# Patient Record
Sex: Female | Born: 1955 | Race: White | Hispanic: No | Marital: Married | Smoking: Never smoker
Health system: Southern US, Community
[De-identification: ages and names within clinical notes are randomized; demographics above are authoritative.]

## PROBLEM LIST (undated history)

## (undated) DIAGNOSIS — N200 Calculus of kidney: Secondary | ICD-10-CM

## (undated) HISTORY — PX: ABDOMINAL HYSTERECTOMY: SHX81

---

## 2009-07-24 ENCOUNTER — Ambulatory Visit: Payer: Self-pay | Admitting: Internal Medicine

## 2013-03-03 ENCOUNTER — Emergency Department: Payer: Self-pay | Admitting: Emergency Medicine

## 2013-03-03 LAB — CBC
MCHC: 34.4 g/dL (ref 32.0–36.0)
MCV: 86 fL (ref 80–100)
Platelet: 277 10*3/uL (ref 150–440)
RBC: 4.88 10*6/uL (ref 3.80–5.20)
RDW: 13.7 % (ref 11.5–14.5)
WBC: 11 10*3/uL (ref 3.6–11.0)

## 2013-03-03 LAB — URINALYSIS, COMPLETE
Blood: NEGATIVE
Nitrite: NEGATIVE
Ph: 8 (ref 4.5–8.0)
Protein: NEGATIVE
RBC,UR: 3 /HPF (ref 0–5)
Squamous Epithelial: NONE SEEN

## 2013-03-03 LAB — BASIC METABOLIC PANEL
Anion Gap: 8 (ref 7–16)
BUN: 19 mg/dL — ABNORMAL HIGH (ref 7–18)
Calcium, Total: 10 mg/dL (ref 8.5–10.1)
Co2: 24 mmol/L (ref 21–32)
Creatinine: 0.97 mg/dL (ref 0.60–1.30)
EGFR (African American): >60
EGFR (Non-African Amer.): >60
Glucose: 133 mg/dL — ABNORMAL HIGH (ref 65–99)
Osmolality: 274 (ref 275–301)

## 2013-03-03 LAB — HEPATIC FUNCTION PANEL A (ARMC)
Alkaline Phosphatase: 114 U/L (ref 50–136)
SGPT (ALT): 31 U/L (ref 12–78)

## 2014-07-18 IMAGING — CT CT ABD-PELV W/ CM
1 of 3 series · 13 of 32 positions shown, 19 images · non-contrast
Comparison: none

REASON FOR EXAM: (1) severe llq pain; (2) severe llq pain
COMMENTS:

PROCEDURE:     CT  - CT ABDOMEN / PELVIS  W  - March 03, 2013  [DATE]
RESULT:     History: Lateral quadrant pain.
Comparison Study: No prior.

[Series 2: 3mm soft tissue · axial · 0.78mm/px · z∈[-90,+342]mm · 13 of 169 slices shown, 19 images]
[im 13/169  soft-tissue]
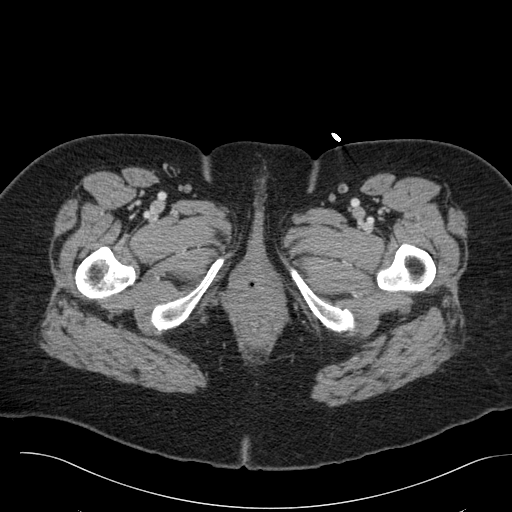
[im 13/169  bone]
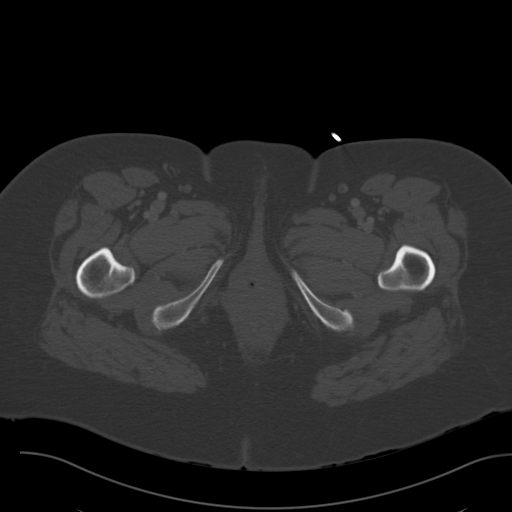
[im 25/169  soft-tissue]
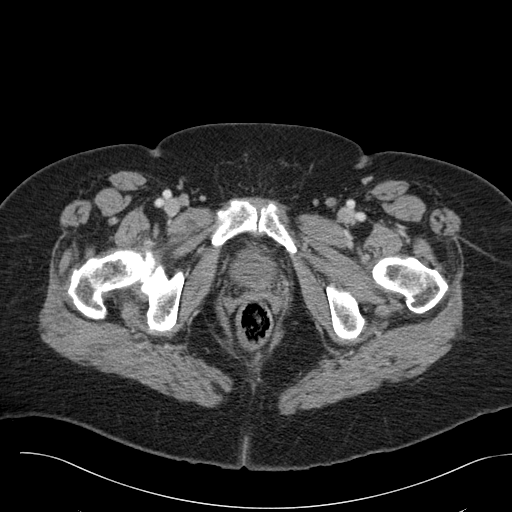
[im 37/169  soft-tissue]
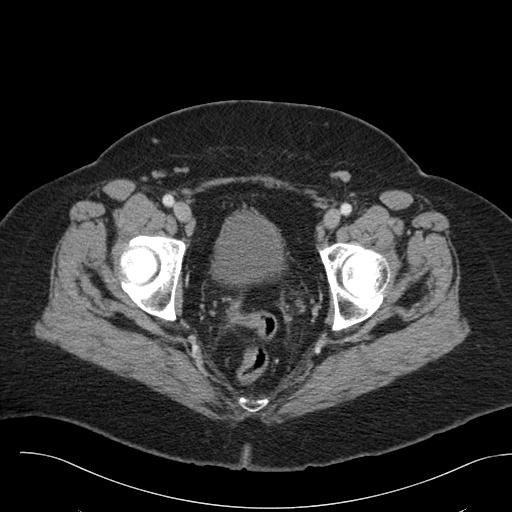
[im 49/169  soft-tissue]
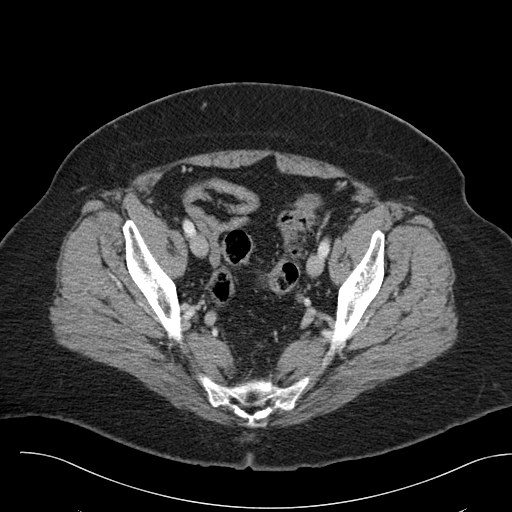
[im 61/169  soft-tissue]
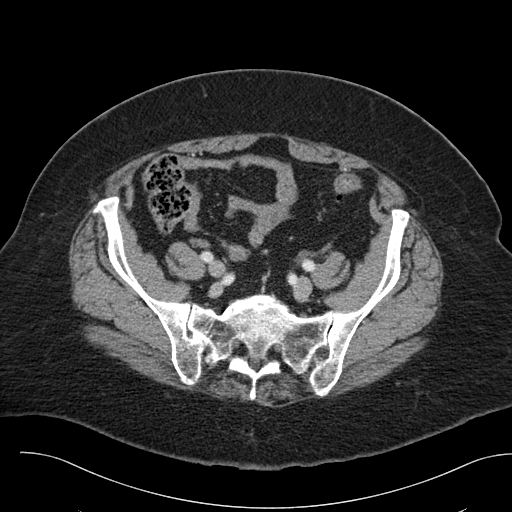
[im 73/169  soft-tissue]
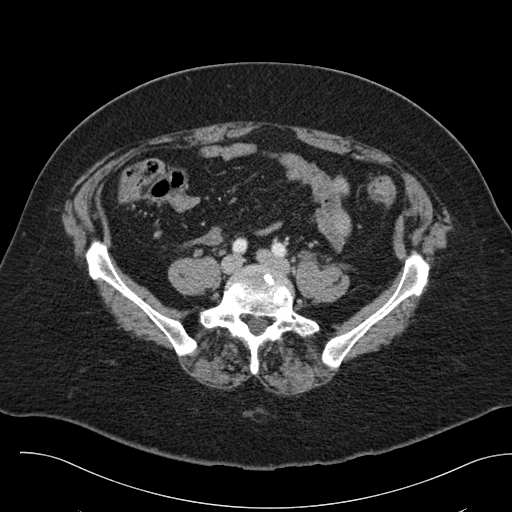
[im 85/169  soft-tissue]
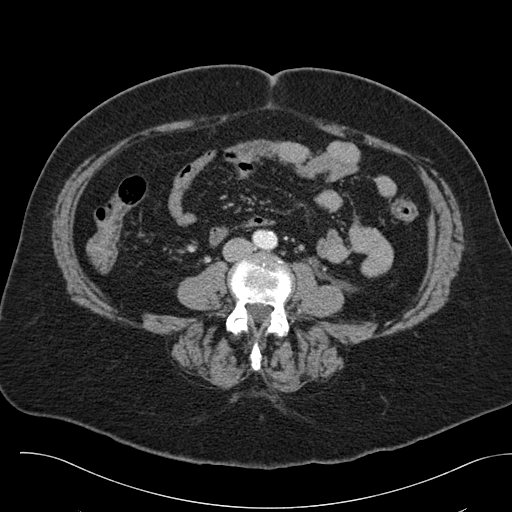
[im 97/169  soft-tissue]
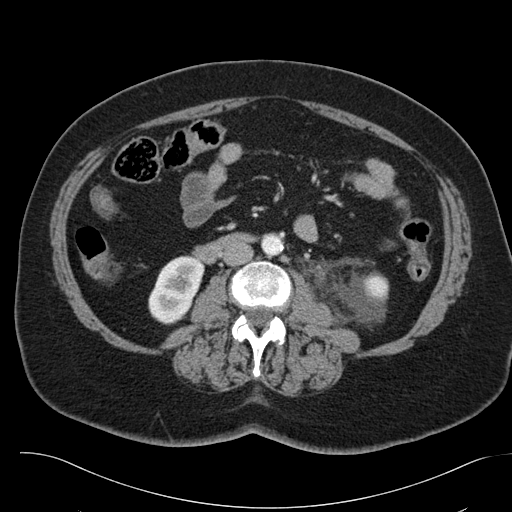
[im 109/169  soft-tissue]
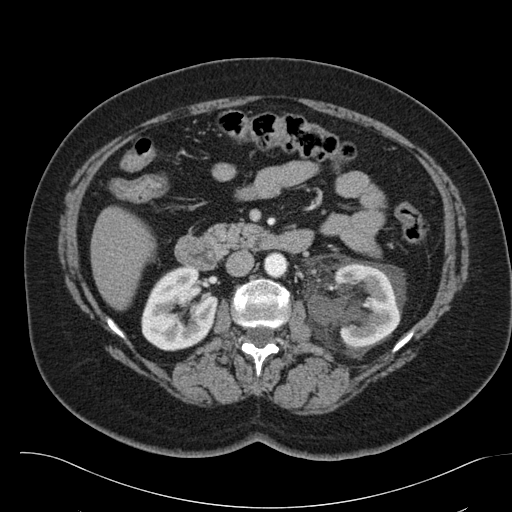
[im 109/169  bone]
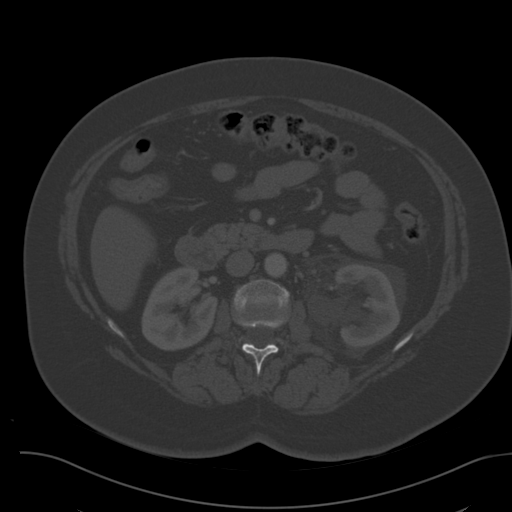
[im 121/169  soft-tissue]
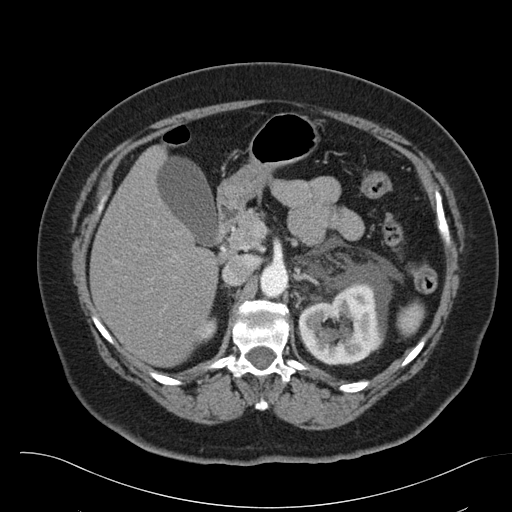
[im 121/169  lung]
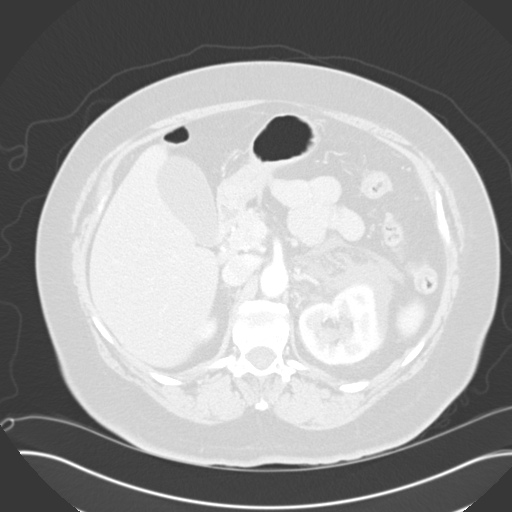
[im 133/169  soft-tissue]
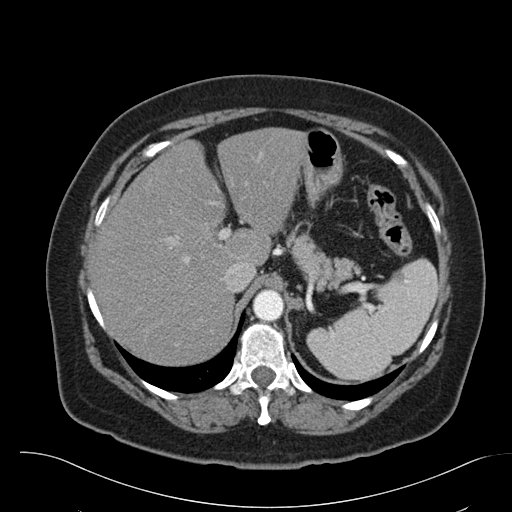
[im 133/169  lung]
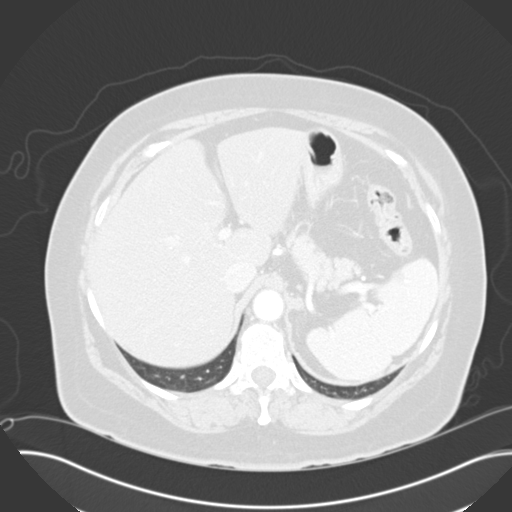
[im 145/169  soft-tissue]
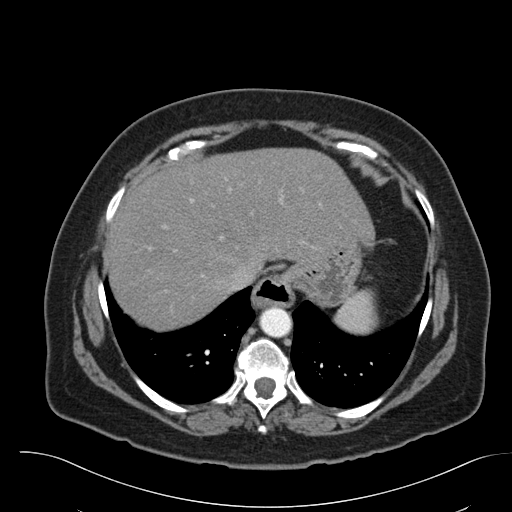
[im 145/169  lung]
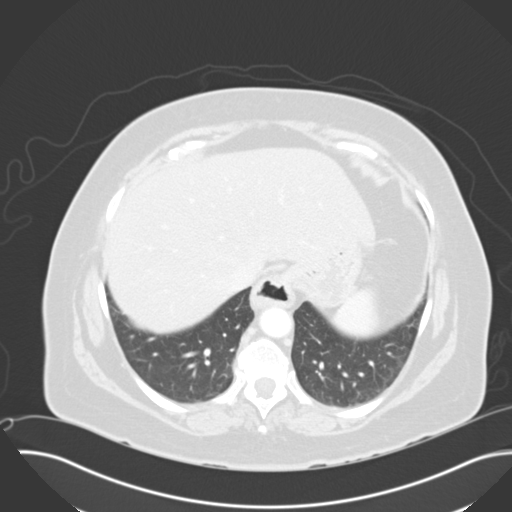
[im 157/169  soft-tissue]
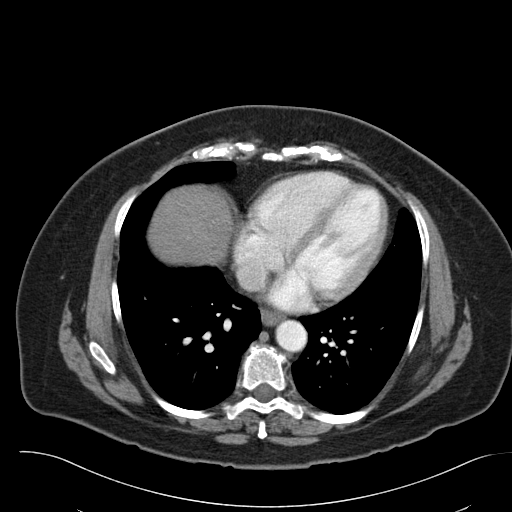
[im 157/169  lung]
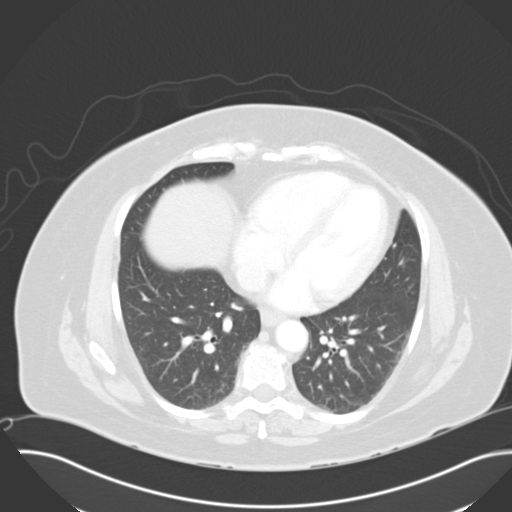

[13 of 32 positions shown; findings below may reference images not displayed]

FINDINGS: Standard CT obtained with 100 cc of [id number]. Evaluation in 3
dimensions on separate workstation performed. Liver normal. Spleen normal.
Gallbladder nondistended.

Adrenals normal. Right kidney and ureter normal. Left hydronephrosis and
hydroureter is present to the level of the ureterovesical junction. There is
a 4 mm stone in the left ureterovesical junction. Perirenal fluid is
present. Bladder is nondistended. Hysterectomy. Adnexa unremarkable.

No adenopathy. Abdominal aorta normal caliber.

Appendix normal. Diverticulosis. No evidence of diverticulitis. No bowel
distention. Small sliding hiatal hernia.

Mild atelectasis lung bases. Heart size normal. No acute bony abnormality.
IMPRESSION: 4 mm stone in the left ureterovesical junction with left
hydronephrosis.

## 2015-12-04 ENCOUNTER — Ambulatory Visit (INDEPENDENT_AMBULATORY_CARE_PROVIDER_SITE_OTHER): Payer: Self-pay

## 2015-12-04 ENCOUNTER — Encounter: Payer: Self-pay | Admitting: Emergency Medicine

## 2015-12-04 ENCOUNTER — Ambulatory Visit
Admission: EM | Admit: 2015-12-04 | Discharge: 2015-12-04 | Disposition: A | Discharge status: Home / Self Care | Payer: Self-pay | Attending: Family Medicine | Admitting: Family Medicine

## 2015-12-04 DIAGNOSIS — S92502A Displaced unspecified fracture of left lesser toe(s), initial encounter for closed fracture: Secondary | ICD-10-CM

## 2015-12-04 HISTORY — DX: Calculus of kidney: N20.0

## 2015-12-04 NOTE — Discharge Instructions (Signed)
Toe Fracture °A toe fracture is a break in one of the toe bones (phalanges). °CAUSES °This condition may be caused by: °· Dropping a heavy object on your toe. °· Stubbing your toe. °· Overusing your toe or doing repetitive exercise. °· Twisting or stretching your toe out of place. °RISK FACTORS °This condition is more likely to develop in people who: °· Play contact sports. °· Have a bone disease. °· Have a low calcium level. °SYMPTOMS °The main symptoms of this condition are swelling and pain in the toe. The pain may get worse with standing or walking. Other symptoms include: °· Bruising. °· Stiffness. °· Numbness. °· A change in the way the toe looks. °· Broken bones that poke through the skin. °· Blood beneath the toenail. °DIAGNOSIS °This condition is diagnosed with a physical exam. You may also have X-rays. °TREATMENT  °Treatment for this condition depends on the type of fracture and its severity. Treatment may involve: °· Taping the broken toe to a toe that is next to it (buddy taping). This is the most common treatment for fractures in which the bone has not moved out of place (nondisplaced fracture). °· Wearing a shoe that has a wide, rigid sole to protect the toe and to limit its movement. °· Wearing a walking cast. °· Having a procedure to move the toe back into place. °· Surgery. This may be needed: °¨ If there are many pieces of broken bone that are out of place (displaced). °¨ If the toe joint breaks. °¨ If the bone breaks through the skin. °· Physical therapy. This is done to help regain movement and strength in the toe. °You may need follow-up X-rays to make sure that the bone is healing well and staying in position. °HOME CARE INSTRUCTIONS °If You Have a Cast: °· Do not stick anything inside the cast to scratch your skin. Doing that increases your risk of infection. °· Check the skin around the cast every day. Report any concerns to your health care provider. You may put lotion on dry skin around the  edges of the cast. Do not apply lotion to the skin underneath the cast. °· Do not put pressure on any part of the cast until it is fully hardened. This may take several hours. °· Keep the cast clean and dry. °Bathing °· Do not take baths, swim, or use a hot tub until your health care provider approves. Ask your health care provider if you can take showers. You may only be allowed to take sponge baths for bathing. °· If your health care provider approves bathing and showering, cover the cast or bandage (dressing) with a watertight plastic bag to protect it from water. Do not let the cast or dressing get wet. °Managing Pain, Stiffness, and Swelling °· If you do not have a cast, apply ice to the injured area, if directed. °¨ Put ice in a plastic bag. °¨ Place a towel between your skin and the bag. °¨ Leave the ice on for 20 minutes, 2-3 times per day. °· Move your toes often to avoid stiffness and to lessen swelling. °· Raise (elevate) the injured area above the level of your heart while you are sitting or lying down. °Driving °· Do not drive or operate heavy machinery while taking pain medicine. °· Do not drive while wearing a cast on a foot that you use for driving. °Activity °· Return to your normal activities as directed by your health care provider. Ask your health care   provider what activities are safe for you. °· Perform exercises daily as directed by your health care provider or physical therapist. °Safety °· Do not use the injured limb to support your body weight until your health care provider says that you can. Use crutches or other assistive devices as directed by your health care provider. °General Instructions °· If your toe was treated with buddy taping, follow your health care provider's instructions for changing the gauze and tape. Change it more often: °¨ The gauze and tape get wet. If this happens, dry the space between the toes. °¨ The gauze and tape are too tight and cause your toe to become pale  or numb. °· Wear a protective shoe as directed by your health care provider. If you were not given a protective shoe, wear sturdy, supportive shoes. Your shoes should not pinch your toes and should not fit tightly against your toes. °· Do not use any tobacco products, including cigarettes, chewing tobacco, or e-cigarettes. Tobacco can delay bone healing. If you need help quitting, ask your health care provider. °· Take medicines only as directed by your health care provider. °· Keep all follow-up visits as directed by your health care provider. This is important. °SEEK MEDICAL CARE IF: °· You have a fever. °· Your pain medicine is not helping. °· Your toe is cold. °· Your toe is numb. °· You still have pain after one week of rest and treatment. °· You still have pain after your health care provider has said that you can start walking again. °· You have pain, tingling, or numbness in your foot that is not going away. °SEEK IMMEDIATE MEDICAL CARE IF: °· You have severe pain. °· You have redness or inflammation in your toe that is getting worse. °· You have pain or numbness in your toe that is getting worse. °· Your toe turns blue. °  °This information is not intended to replace advice given to you by your health care provider. Make sure you discuss any questions you have with your health care provider. °  °Document Released: 07/04/2000 Document Revised: 03/28/2015 Document Reviewed: 05/03/2014 °[employer]vier Interactive Patient Education ©2016 [employer] ° °

## 2015-12-04 NOTE — ED Notes (Signed)
Patient states that she tripped and hit her toes on her left foot on her doggie steps on Sunday.  Patient c/o pain in her left #rd, 4th, and 5th toes.

## 2015-12-04 NOTE — ED Provider Notes (Signed)
CSN: [id number]     Arrival date & time 12/04/15  1149 History   First MD Initiated Contact with Patient 12/04/15 1306    Nurses notes were reviewed. Chief Complaint  Patient presents with  . Toe Pain  . Toe Injury  Patient reports tripping over the dog steps to go to her bed and falling forward. Apparently when she fell her left foot the third fourth and fifth toes bent under her and she fell forward. States the pain was excruciating at the time and her ability to cannulate this causing increased pain since she came in to be seen and evaluated. Past medical history she's had a history of kidney stones abdominal hysterectomy. She denies any medical problem and no pertinent family medical problems pertaining to this visit (Consider location/radiation/quality/duration/timing/severity/associated sxs/prior Treatment) Patient is a 60 y.o. female presenting with toe pain. The history is provided by the patient. No language interpreter was used.  Toe Pain This is a new problem. The current episode started more than 2 days ago. The problem occurs constantly. The problem has been gradually worsening. Pertinent negatives include no chest pain, no abdominal pain and no shortness of breath. The symptoms are aggravated by walking. Nothing relieves the symptoms. She has tried nothing for the symptoms. The treatment provided no relief.    Past Medical History  Diagnosis Date  . Kidney stones    Past Surgical History  Procedure Laterality Date  . Abdominal hysterectomy     History reviewed. No pertinent family history. Social History  Substance Use Topics  . Smoking status: Never Smoker   . Smokeless tobacco: None  . Alcohol Use: Yes   OB History    No data available     Review of Systems  Respiratory: Negative for shortness of breath.   Cardiovascular: Negative for chest pain.  Gastrointestinal: Negative for abdominal pain.    Allergies  Review of patient's allergies indicates no known  allergies.  Home Medications   Prior to Admission medications   Not on File   Meds Ordered and Administered this Visit  Medications - No data to display  BP 130/78 mmHg  Pulse 67  Temp(Src) 97.5 F (36.4 C) (Tympanic)  Resp 16  Ht  (1.651 m)  Wt 225 lb (102.059 kg)  BMI 37.44 kg/m2  SpO2 99% No data found.   Physical Exam  Constitutional: She is oriented to person, place, and time. She appears well-developed and well-nourished.  HENT:  Head: Normocephalic and atraumatic.  Eyes: Pupils are equal, round, and reactive to light.  Neck: Neck supple.  Musculoskeletal: Normal range of motion.       Feet:  Patient with tenderness over the third fourth and fifth toes and also the fourth metatarsal bones as well. There is some swelling along the whole dorsum of the foot.  Neurological: She is alert and oriented to person, place, and time.  Skin: Skin is warm.  Psychiatric: She has a normal mood and affect.  Vitals reviewed.   ED Course  Procedures (including critical care time)  Labs Review Labs Reviewed - No data to display  Imaging Review Dg Foot Complete Left  12/04/2015  CLINICAL DATA:  Injured left foot 3 days ago EXAM: LEFT FOOT - COMPLETE 3+ VIEW COMPARISON:  None. FINDINGS: There is an acute fracture involving the mid and distal shaft of the the fourth proximal phalanx. The fracture fragments are in near anatomic alignment. Mild dorsal soft tissue swelling noted. Plantar and posterior calcaneal heel  spurs noted. IMPRESSION: 1. Acute fracture involves the proximal phalanx of the fourth toe. Electronically Signed   By: Signa Kellaylor  Stroud M.D.   On: 12/04/2015 13:46     Visual Acuity Review  Right Eye Distance:   Left Eye Distance:   Bilateral Distance:    Right Eye Near:   Left Eye Near:    Bilateral Near:         MDM   1. Fracture of fourth toe, left, closed, initial encounter    We'll place patient third fourth toes together buddy tape them for  support for the fourth toe will provide orthosis to bruit. Follow-up PCP in 2-4 weeks. 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   Note: This dictation was prepared with Dragon dictation along with smaller phrase technology. Any transcriptional errors that result from this process are unintentional.     Hassan RowanEugene Layten Aiken, MD 12/04/15 1419

## 2015-12-04 NOTE — ED Notes (Signed)
Patient tried on a post op shoe and states that she does not have insurance right now and that she could not tell a difference between the post op shoe and her flip flops.  Patient states that she did not want to get the post op shoe at this time.

## 2017-04-19 IMAGING — CR DG FOOT COMPLETE 3+V*L*
4 series · 4 of 4 positions shown · non-contrast
Comparison: None.

CLINICAL DATA: Injured left foot 3 days ago

EXAM:
LEFT FOOT - COMPLETE 3+ VIEW

[foot ap]
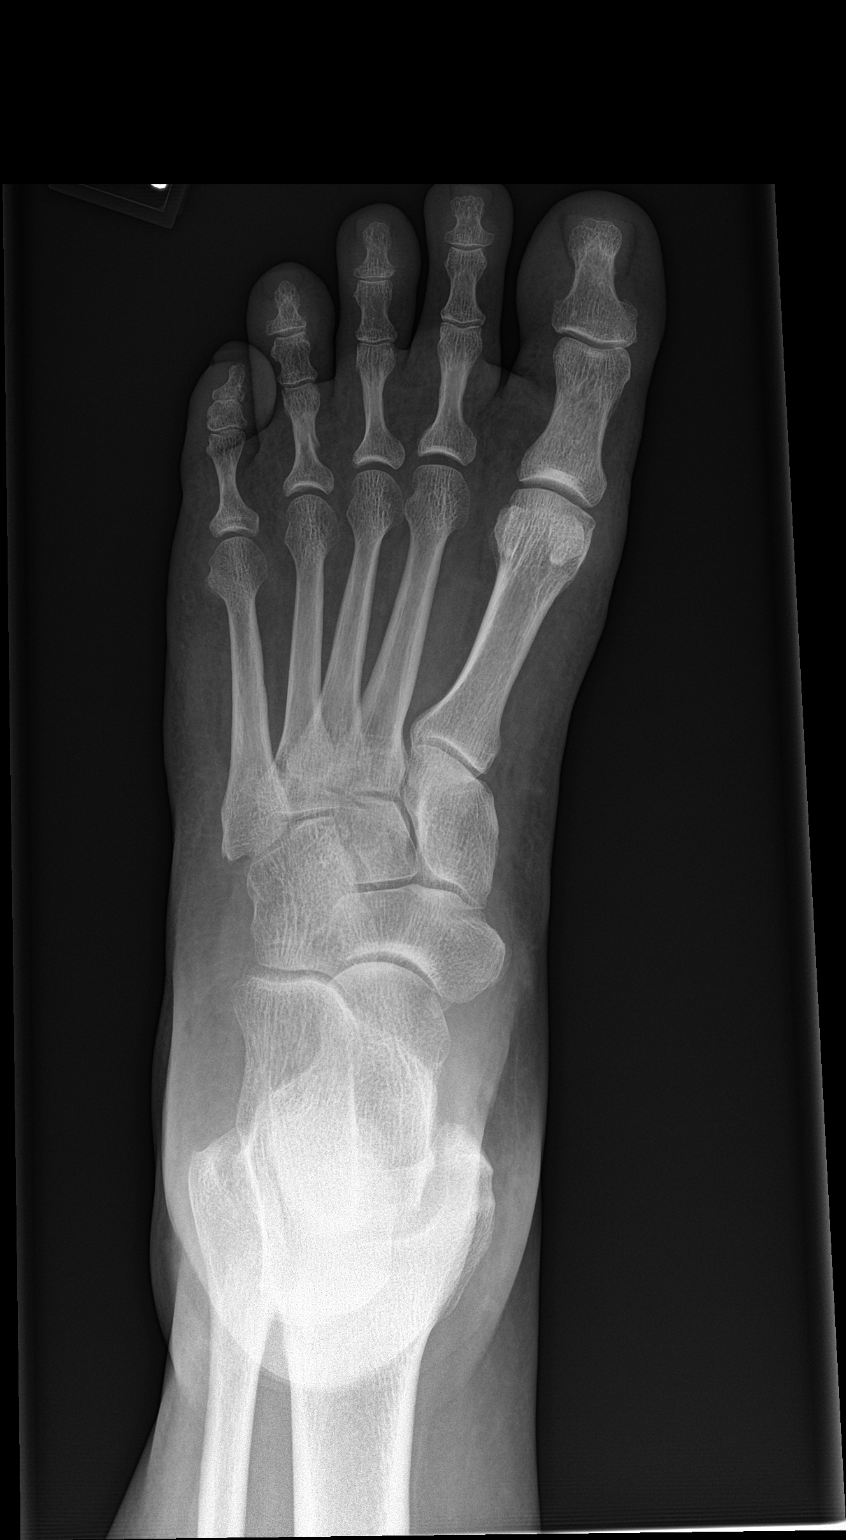

[foot obl]
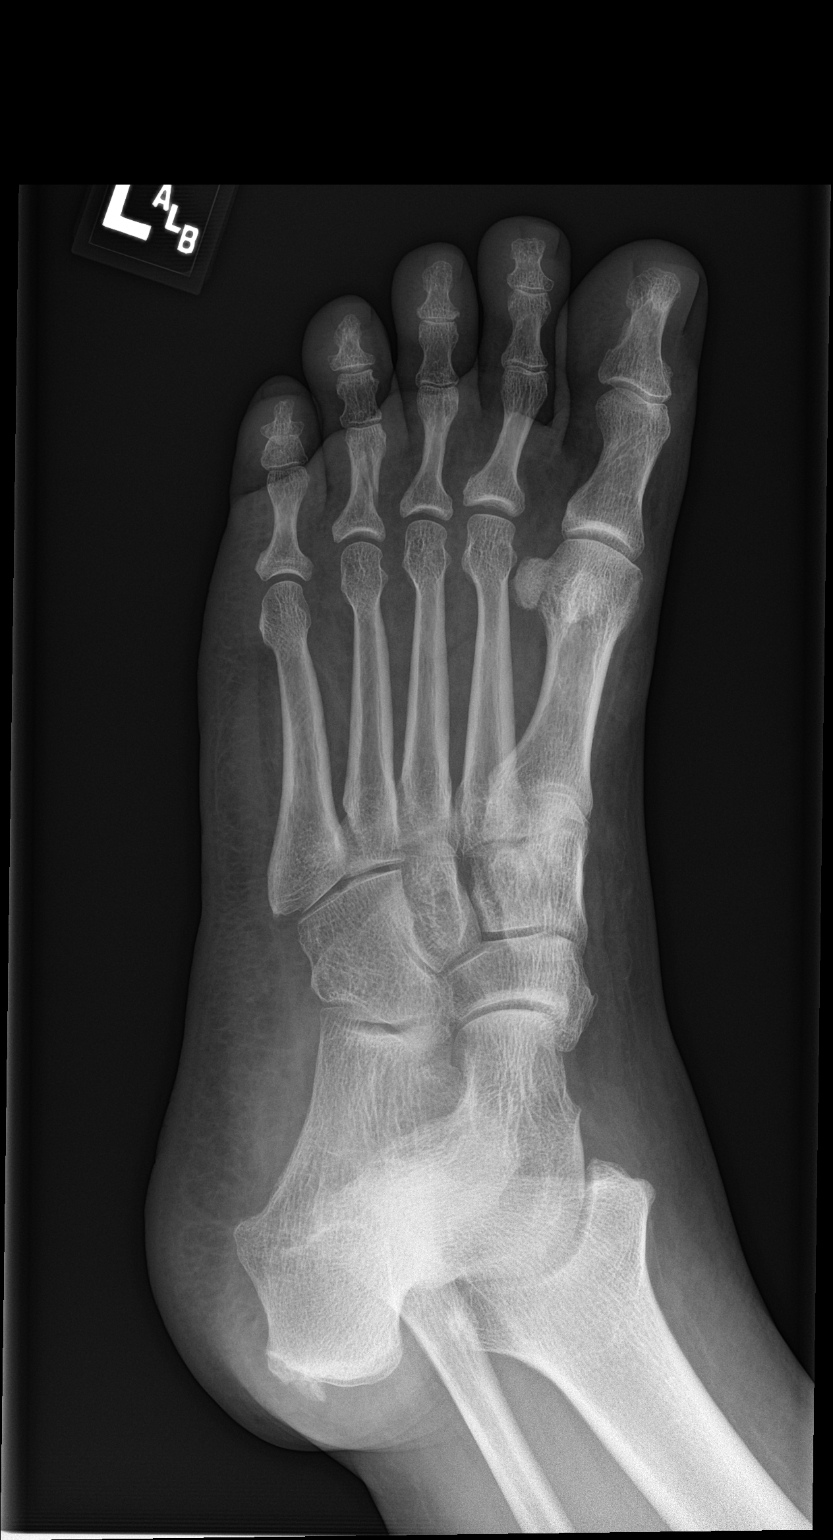

[foot lat (1 of 2)]
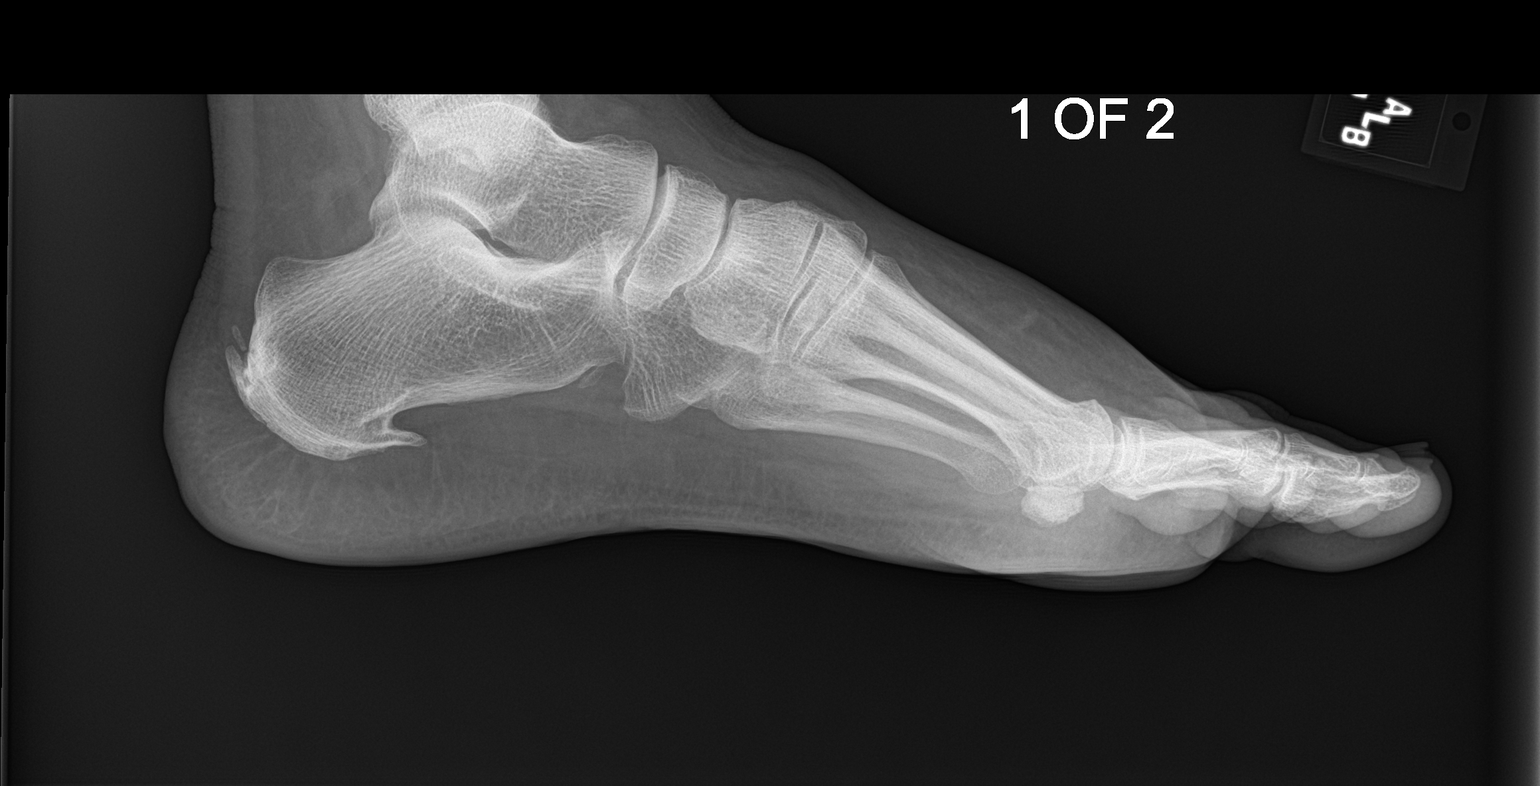

[foot lat (2 of 2)]
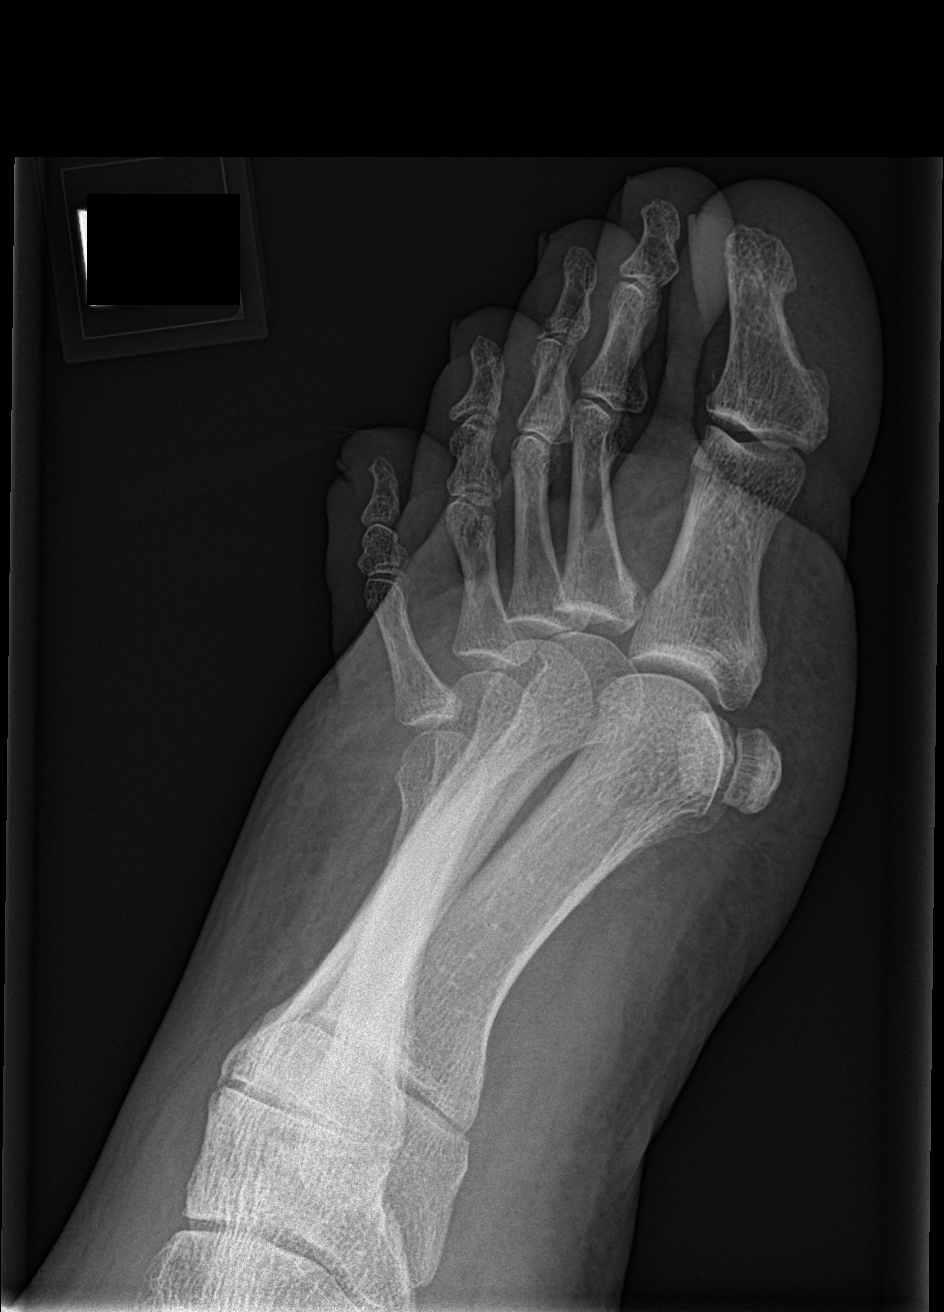

[4 of 4 positions shown; findings below may reference images not displayed]

FINDINGS: There is an acute fracture involving the mid and distal shaft of the
the fourth proximal phalanx. The fracture fragments are in near
anatomic alignment. Mild dorsal soft tissue swelling noted. Plantar
and posterior calcaneal heel spurs noted.
IMPRESSION: 1. Acute fracture involves the proximal phalanx of the fourth toe.
